# Patient Record
Sex: Male | Born: 1990 | Hispanic: No | Marital: Single | Smoking: Never smoker
Health system: Southern US, Community
[De-identification: ages and names within clinical notes are randomized; demographics above are authoritative.]

## PROBLEM LIST (undated history)

## (undated) DIAGNOSIS — F209 Schizophrenia, unspecified: Secondary | ICD-10-CM

---

## 2014-08-28 ENCOUNTER — Ambulatory Visit (HOSPITAL_COMMUNITY)
Admission: AD | Admit: 2014-08-28 | Discharge: 2014-08-28 | Disposition: A | Discharge status: Home / Self Care | Payer: 59 | Attending: Psychiatry | Admitting: Psychiatry

## 2014-08-28 ENCOUNTER — Emergency Department (HOSPITAL_COMMUNITY)
Admission: EM | Admit: 2014-08-28 | Discharge: 2014-08-29 | Disposition: A | Discharge status: Other Acute Inpatient Hospital | Payer: 59 | Attending: Emergency Medicine | Admitting: Emergency Medicine

## 2014-08-28 ENCOUNTER — Encounter (HOSPITAL_COMMUNITY): Payer: Self-pay | Admitting: *Deleted

## 2014-08-28 DIAGNOSIS — F419 Anxiety disorder, unspecified: Secondary | ICD-10-CM | POA: Diagnosis not present

## 2014-08-28 DIAGNOSIS — F209 Schizophrenia, unspecified: Secondary | ICD-10-CM | POA: Diagnosis not present

## 2014-08-28 DIAGNOSIS — Z008 Encounter for other general examination: Secondary | ICD-10-CM | POA: Diagnosis present

## 2014-08-28 DIAGNOSIS — F200 Paranoid schizophrenia: Secondary | ICD-10-CM | POA: Diagnosis present

## 2014-08-28 LAB — CBC
HCT: 47.3 % (ref 39.0–52.0)
HEMOGLOBIN: 15.6 g/dL (ref 13.0–17.0)
MCH: 30.8 pg (ref 26.0–34.0)
MCHC: 33 g/dL (ref 30.0–36.0)
MCV: 93.5 fL (ref 78.0–100.0)
PLATELETS: 272 10*3/uL (ref 150–400)
RBC: 5.06 MIL/uL (ref 4.22–5.81)
RDW: 12.7 % (ref 11.5–15.5)
WBC: 6.6 10*3/uL (ref 4.0–10.5)

## 2014-08-28 LAB — COMPREHENSIVE METABOLIC PANEL
ALT: 27 U/L (ref 17–63)
ANION GAP: 10 (ref 5–15)
AST: 29 U/L (ref 15–41)
Albumin: 5 g/dL (ref 3.5–5.0)
Alkaline Phosphatase: 74 U/L (ref 38–126)
BUN: 13 mg/dL (ref 6–20)
CO2: 32 mmol/L (ref 22–32)
CREATININE: 0.98 mg/dL (ref 0.61–1.24)
Calcium: 10 mg/dL (ref 8.9–10.3)
Chloride: 100 mmol/L — ABNORMAL LOW (ref 101–111)
GFR calc Af Amer: >60 mL/min (ref >60)
GFR calc non Af Amer: >60 mL/min (ref >60)
Glucose, Bld: 90 mg/dL (ref 65–99)
Potassium: 4.3 mmol/L (ref 3.5–5.1)
Sodium: 142 mmol/L (ref 135–145)
Total Bilirubin: 0.5 mg/dL (ref 0.3–1.2)
Total Protein: 8.3 g/dL — ABNORMAL HIGH (ref 6.5–8.1)

## 2014-08-28 LAB — ACETAMINOPHEN LEVEL: Acetaminophen (Tylenol), Serum: <10 ug/mL — ABNORMAL LOW (ref 10–30)

## 2014-08-28 LAB — RAPID URINE DRUG SCREEN, HOSP PERFORMED
Amphetamines: NOT DETECTED
Barbiturates: NOT DETECTED
Benzodiazepines: NOT DETECTED
Cocaine: NOT DETECTED
OPIATES: NOT DETECTED
TETRAHYDROCANNABINOL: NOT DETECTED

## 2014-08-28 LAB — ETHANOL: Alcohol, Ethyl (B): <5 mg/dL (ref <5)

## 2014-08-28 LAB — SALICYLATE LEVEL

## 2014-08-28 NOTE — ED Notes (Signed)
Pt presents with complaint of erratic behavior, paranoid & suspicious.  Denies SI, HI or AV hallucinations.  Denies feeling hopeless.  Pt reports he has never been diagnosed with mental illness. AAO x 3, no distress noted, calm & cooperative, monitoring for safety, Q 15 min checks in effect.

## 2014-08-28 NOTE — BH Assessment (Addendum)
Inpt recommended. [facility] per Clint Bolderori Beck, [facility] TTS seeking placement.   Sent referrals to: [facility]   Krisi Azua, [place] Triage Specialist 08/28/2014 10:18 PM

## 2014-08-28 NOTE — ED Provider Notes (Signed)
CSN: [id number]     Arrival date & time 08/28/14  1655 History   First MD Initiated Contact with Patient 08/28/14 1757     Chief Complaint  Patient presents with  . Medical Clearance     (Consider location/radiation/quality/duration/timing/severity/associated sxs/prior Treatment) Patient is a 24 y.o. male presenting with mental health disorder. The history is provided by the patient. No language interpreter was used.  Mental Health Problem Presenting symptoms: bizarre behavior   Degree of incapacity (severity):  Unable to specify Onset quality:  Unable to specify Timing:  Unable to specify Context: not alcohol use, not drug abuse, not medication and not stressful life event   Relieved by:  Nothing Worsened by:  Nothing tried Ineffective treatments:  None tried Associated symptoms: anxiety   Pt reports he is here because his family worries about him.  Pt reports he is anxious about his responsibility's. Pt denies suicidal or homicidal thoughts.  Pt denies depression.  Pt is difficult to converse with. He speaks slowly and seems distracted,  Pt admits interpersonal problems with people at his church.    No past medical history on file. No past surgical history on file. No family history on file. History  Substance Use Topics  . Smoking status: Not on file  . Smokeless tobacco: Not on file  . Alcohol Use: No    Review of Systems  Psychiatric/Behavioral: The patient is nervous/anxious.   All other systems reviewed and are negative.     Allergies  Poison ivy extract  Home Medications   Prior to Admission medications   Not on File   BP 144/96 mmHg  Pulse 77  Temp(Src) 98.7 F (37.1 C) (Oral)  Resp 18  SpO2 100% Physical Exam  Constitutional: He is oriented to person, place, and time. He appears well-developed and well-nourished.  HENT:  Head: Normocephalic.  Right Ear: External ear normal.  Left Ear: External ear normal.  Nose: Nose normal.  Eyes: Conjunctivae  and EOM are normal. Pupils are equal, round, and reactive to light.  Neck: Normal range of motion.  Cardiovascular: Normal rate and normal heart sounds.   Pulmonary/Chest: Effort normal.  Abdominal: Soft. He exhibits no distension.  Musculoskeletal: Normal range of motion.  Neurological: He is alert and oriented to person, place, and time.  Skin: Skin is warm.  Nursing note and vitals reviewed.   ED Course  Procedures (including critical care time) Labs Review Labs Reviewed  ACETAMINOPHEN LEVEL  CBC  COMPREHENSIVE METABOLIC PANEL  ETHANOL  SALICYLATE LEVEL  URINE RAPID DRUG SCREEN (HOSP PERFORMED)    Imaging Review No results found.   EKG Interpretation None     Results for orders placed or performed during the hospital encounter of 08/28/14  Acetaminophen level  Result Value Ref Range   Acetaminophen (Tylenol), Serum <10 (L) 10 - 30 ug/mL  CBC  Result Value Ref Range   WBC 6.6 4.0 - 10.5 K/uL   RBC 5.06 4.22 - 5.81 MIL/uL   Hemoglobin 15.6 13.0 - 17.0 g/dL   HCT 40.9 81.1 - 91.4 %   MCV 93.5 78.0 - 100.0 fL   MCH 30.8 26.0 - 34.0 pg   MCHC 33.0 30.0 - 36.0 g/dL   RDW 78.2 95.6 - 21.3 %   Platelets 272 150 - 400 K/uL  Comprehensive metabolic panel  Result Value Ref Range   Sodium 142 135 - 145 mmol/L   Potassium 4.3 3.5 - 5.1 mmol/L   Chloride 100 (L) 101 - 111 mmol/L  CO2 32 22 - 32 mmol/L   Glucose, Bld 90 65 - 99 mg/dL   BUN 13 6 - 20 mg/dL   Creatinine, Ser 1.610.98 0.61 - 1.24 mg/dL   Calcium 09.610.0 8.9 - 04.510.3 mg/dL   Total Protein 8.3 (H) 6.5 - 8.1 g/dL   Albumin 5.0 3.5 - 5.0 g/dL   AST 29 15 - 41 U/L   ALT 27 17 - 63 U/L   Alkaline Phosphatase 74 38 - 126 U/L   Total Bilirubin 0.5 0.3 - 1.2 mg/dL   GFR calc non Af Amer >60 >60 mL/min   GFR calc Af Amer >60 >60 mL/min   Anion gap 10 5 - 15  Ethanol (ETOH)  Result Value Ref Range   Alcohol, Ethyl (B) <5 <5 mg/dL  Salicylate level  Result Value Ref Range   Salicylate Lvl <4.0 2.8 - 30.0 mg/dL   Urine Drug Screen  Result Value Ref Range   Opiates NONE DETECTED NONE DETECTED   Cocaine NONE DETECTED NONE DETECTED   Benzodiazepines NONE DETECTED NONE DETECTED   Amphetamines NONE DETECTED NONE DETECTED   Tetrahydrocannabinol NONE DETECTED NONE DETECTED   Barbiturates NONE DETECTED NONE DETECTED   No results found.  MDM [facility] and was sent here for clearance and Psychiatric evaluation.  Notes report a family history of schizophrenia.    Final diagnoses:  None        Elson AreasLeslie K Sofia, PA-C 08/28/14 1838  Lonia SkinnerLeslie K Alice AcresSofia, PA-C 08/28/14 1901  Blake DivineJohn Wofford, MD 09/01/14 2033
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
## 2014-08-28 NOTE — ED Notes (Signed)
Patient will not sit onto stretcher he has been pacing. He prefers to stand

## 2014-08-28 NOTE — Progress Notes (Signed)
Patient presents to Ed with erratic behavior, apparent hallucinations, and aggression.  Per chart review, patient brought in by family.  Patient appeared nervous/suspicious when [facility] to him at bedside. Per chart review, patient without psychiatric history but with family history of schizophrenia.  Patient listed as having [employer].  Patient reports he does not have a pcp.  [facility] provided [facility] with list of [facility] who [facility] within a 20 mile radius of listed zip code [phone].  Discussed purpose of pcp.  Patient thankful for resources.  Patient asking if blood work is back yet.  EDCM asked EDRN.  No further EDCM needs at this time.

## 2014-08-29 DIAGNOSIS — F200 Paranoid schizophrenia: Secondary | ICD-10-CM | POA: Diagnosis present
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
## 2014-08-29 NOTE — BH Assessment (Addendum)
[facility] Assessment Progress Note   [facility]resa at [facility] said that patient had been accepted by Dr. Lonni Fixaj Thotokura.  [facility].  Nurse call report number is [phone].  She said that if patient is too psychotic he would need to be IVC'ed.   If he is willing to go and sign himself in than he can come voluntarily.  Clinician talked with patient and explained that [facility].  Clinician explained that he can go voluntarily and that transportation would be provided but he would need to sign himself in when he gets there.  Clinician gave patient opportunity to think it over and to talk with oncoming staff after 07:30.  Psychiatric team to consider IVC if clinically appropriate.

## 2014-09-26 ENCOUNTER — Ambulatory Visit (HOSPITAL_COMMUNITY): Payer: Self-pay | Admitting: Psychiatry

## 2014-10-02 ENCOUNTER — Ambulatory Visit (HOSPITAL_COMMUNITY): Payer: Self-pay | Admitting: Licensed Clinical Social Worker

## 2016-03-19 ENCOUNTER — Emergency Department (HOSPITAL_COMMUNITY): Payer: BLUE CROSS/BLUE SHIELD

## 2016-03-19 ENCOUNTER — Encounter (HOSPITAL_COMMUNITY): Payer: Self-pay

## 2016-03-19 ENCOUNTER — Emergency Department (HOSPITAL_COMMUNITY)
Admission: EM | Admit: 2016-03-19 | Discharge: 2016-03-20 | Disposition: A | Discharge status: Other Acute Inpatient Hospital | Payer: BLUE CROSS/BLUE SHIELD | Attending: Emergency Medicine | Admitting: Emergency Medicine

## 2016-03-19 DIAGNOSIS — Z79899 Other long term (current) drug therapy: Secondary | ICD-10-CM | POA: Diagnosis not present

## 2016-03-19 DIAGNOSIS — S1081XA Abrasion of other specified part of neck, initial encounter: Secondary | ICD-10-CM | POA: Insufficient documentation

## 2016-03-19 DIAGNOSIS — R930 Abnormal findings on diagnostic imaging of skull and head, not elsewhere classified: Secondary | ICD-10-CM | POA: Diagnosis not present

## 2016-03-19 DIAGNOSIS — F200 Paranoid schizophrenia: Secondary | ICD-10-CM | POA: Insufficient documentation

## 2016-03-19 DIAGNOSIS — Y939 Activity, unspecified: Secondary | ICD-10-CM | POA: Insufficient documentation

## 2016-03-19 DIAGNOSIS — Y9222 Religious institution as the place of occurrence of the external cause: Secondary | ICD-10-CM | POA: Diagnosis not present

## 2016-03-19 DIAGNOSIS — Z9109 Other allergy status, other than to drugs and biological substances: Secondary | ICD-10-CM | POA: Diagnosis not present

## 2016-03-19 DIAGNOSIS — X58XXXA Exposure to other specified factors, initial encounter: Secondary | ICD-10-CM | POA: Diagnosis not present

## 2016-03-19 DIAGNOSIS — Y999 Unspecified external cause status: Secondary | ICD-10-CM | POA: Insufficient documentation

## 2016-03-19 DIAGNOSIS — R45851 Suicidal ideations: Secondary | ICD-10-CM | POA: Diagnosis not present

## 2016-03-19 DIAGNOSIS — S199XXA Unspecified injury of neck, initial encounter: Secondary | ICD-10-CM | POA: Diagnosis present

## 2016-03-19 HISTORY — DX: Schizophrenia, unspecified: F20.9

## 2016-03-19 LAB — CBC WITH DIFFERENTIAL/PLATELET
BASOS ABS: 0 10*3/uL (ref 0.0–0.1)
BASOS PCT: 0 %
EOS ABS: 0 10*3/uL (ref 0.0–0.7)
Eosinophils Relative: 0 %
HEMATOCRIT: 45 % (ref 39.0–52.0)
Hemoglobin: 15.8 g/dL (ref 13.0–17.0)
Lymphocytes Relative: 10 %
Lymphs Abs: 1.6 10*3/uL (ref 0.7–4.0)
MCH: 31.3 pg (ref 26.0–34.0)
MCHC: 35.1 g/dL (ref 30.0–36.0)
MCV: 89.3 fL (ref 78.0–100.0)
MONOS PCT: 9 %
Monocytes Absolute: 1.4 10*3/uL — ABNORMAL HIGH (ref 0.1–1.0)
NEUTROS PCT: 81 %
Neutro Abs: 13 10*3/uL — ABNORMAL HIGH (ref 1.7–7.7)
Platelets: 295 10*3/uL (ref 150–400)
RBC: 5.04 MIL/uL (ref 4.22–5.81)
RDW: 12.6 % (ref 11.5–15.5)
WBC: 16 10*3/uL — AB (ref 4.0–10.5)

## 2016-03-19 LAB — URINALYSIS, ROUTINE W REFLEX MICROSCOPIC
BILIRUBIN URINE: NEGATIVE
GLUCOSE, UA: NEGATIVE mg/dL
KETONES UR: 80 mg/dL — AB
LEUKOCYTES UA: NEGATIVE
NITRITE: NEGATIVE
PH: 6 (ref 5.0–8.0)
PROTEIN: 30 mg/dL — AB
Specific Gravity, Urine: 1.029 (ref 1.005–1.030)
Squamous Epithelial / LPF: NONE SEEN

## 2016-03-19 LAB — COMPREHENSIVE METABOLIC PANEL
ALK PHOS: 68 U/L (ref 38–126)
ALT: 17 U/L (ref 17–63)
ANION GAP: 18 — AB (ref 5–15)
AST: 39 U/L (ref 15–41)
Albumin: 5.1 g/dL — ABNORMAL HIGH (ref 3.5–5.0)
BILIRUBIN TOTAL: 1.5 mg/dL — AB (ref 0.3–1.2)
BUN: 22 mg/dL — ABNORMAL HIGH (ref 6–20)
CALCIUM: 9.5 mg/dL (ref 8.9–10.3)
CO2: 23 mmol/L (ref 22–32)
Chloride: 98 mmol/L — ABNORMAL LOW (ref 101–111)
Creatinine, Ser: 0.99 mg/dL (ref 0.61–1.24)
GFR calc non Af Amer: >60 mL/min (ref >60)
Glucose, Bld: 98 mg/dL (ref 65–99)
POTASSIUM: 3.9 mmol/L (ref 3.5–5.1)
SODIUM: 139 mmol/L (ref 135–145)
TOTAL PROTEIN: 7.8 g/dL (ref 6.5–8.1)

## 2016-03-19 LAB — RAPID URINE DRUG SCREEN, HOSP PERFORMED
Amphetamines: NOT DETECTED
BARBITURATES: NOT DETECTED
BENZODIAZEPINES: NOT DETECTED
Cocaine: NOT DETECTED
Opiates: NOT DETECTED
Tetrahydrocannabinol: NOT DETECTED

## 2016-03-19 LAB — SALICYLATE LEVEL

## 2016-03-19 LAB — ACETAMINOPHEN LEVEL: Acetaminophen (Tylenol), Serum: <10 ug/mL — ABNORMAL LOW (ref 10–30)

## 2016-03-19 LAB — CK: CK TOTAL: 862 U/L — AB (ref 49–397)

## 2016-03-19 LAB — ETHANOL: Alcohol, Ethyl (B): <5 mg/dL (ref <5)

## 2016-03-19 MED ORDER — LORAZEPAM 2 MG/ML IJ SOLN
1.0000 mg | Freq: Once | INTRAMUSCULAR | Status: AC
  Administered 2016-03-19: 1 mg via INTRAVENOUS
  Filled 2016-03-19: qty 1

## 2016-03-19 MED ORDER — SODIUM CHLORIDE 0.9 % IV BOLUS (SEPSIS)
1000.0000 mL | Freq: Once | INTRAVENOUS | Status: AC
  Administered 2016-03-19: 1000 mL via INTRAVENOUS

## 2016-03-19 MED ORDER — LORAZEPAM 1 MG PO TABS: 1.0000 mg | ORAL_TABLET | Freq: Three times a day (TID) | ORAL | Status: DC | PRN

## 2016-03-19 MED ORDER — IBUPROFEN 200 MG PO TABS: 600.0000 mg | ORAL_TABLET | Freq: Three times a day (TID) | ORAL | Status: DC | PRN

## 2016-03-19 MED ORDER — ONDANSETRON HCL 4 MG PO TABS: 4.0000 mg | ORAL_TABLET | Freq: Three times a day (TID) | ORAL | Status: DC | PRN

## 2016-03-19 MED ORDER — ALUM & MAG HYDROXIDE-SIMETH 200-200-20 MG/5ML PO SUSP: 30.0000 mL | ORAL | Status: DC | PRN

## 2016-03-19 MED ORDER — HALOPERIDOL LACTATE 5 MG/ML IJ SOLN
5.0000 mg | Freq: Once | INTRAMUSCULAR | Status: AC
  Administered 2016-03-19: 5 mg via INTRAMUSCULAR
  Filled 2016-03-19: qty 1

## 2016-03-19 NOTE — ED Notes (Signed)
He becomes suddenly agitated and attempts to take his IV out. Security notified and pt. Is successfully calmed and redirected.

## 2016-03-19 NOTE — ED Notes (Signed)
He continues to breath normally and is in no distress. He mumbles and moves all extremities when we obtain his blood sample. He had placed himself prone; and repositions himself to his back when prompted.

## 2016-03-19 NOTE — BH Assessment (Addendum)
Assessment Note  Jose Klein is an 25 y.o. male with a history of Schizophrenia. Per EMS/ED notes patient was reportedly found outside in the cold. Family has reportedly been trying to locate him and they were concerned about suicidal thoughts. Upon assessment, pt appeared preoccupied, looking around the room, had what appeared to be thought blocking, and tangential speech.  He had soft speech, was oriented x 3, and appeared bizarre, in regular clothing.  Pt's main focus was on "black spells, magic balls, and black magic" and this is what he talked about for most of the assessment, but this was easily redirected. Pt stated he was a young member of a new church that had been "brainwashed" by the man. Writer asked patient further about the church. Patient would not further explain. He did however start to ask for various people, "Jose Klein, and Jose Klein". Writer asked whom these individuals were and patient stated, "I don't know".  Pt stated there were "points of contention," "animosity," and that he had been chosen to lead a congregation. Patient further stated that people were mocking him but he couldn't explain anything further.  Pt denies SI or HI. He denies history of SI and HI. No history of self injurious behaviors. Patient denies depressive symptoms. Patient denies anxiety related symptoms. Patient is calm and cooperative. No legal issues.  Pt denies AVH. Patient denies alcohol and drug use. No history of outpatient mental health treatment. No history of INPt mental health history.   Diagnosis: Schizophrenia  Past Medical History:  Past Medical History:  Diagnosis Date  . Schizophrenia (HCC)     History reviewed. No pertinent surgical history.  Family History: History reviewed. No pertinent family history.  Social History:  reports that he has never smoked. He has never used smokeless tobacco. He reports that he does not drink alcohol or use drugs.  Additional Social History:  Alcohol /  Drug Use Pain Medications: none Prescriptions: none Over the Counter: none History of alcohol / drug use?: No history of alcohol / drug abuse  CIWA: CIWA-Ar BP: 107/55 Pulse Rate: 96 COWS:    Allergies:  Allergies  Allergen Reactions  . Poison Ivy Extract [Poison Ivy Extract] Itching and Rash    Home Medications:  (Not in a hospital admission)  OB/GYN Status:  No LMP for male patient.  General Assessment Data Location of Assessment: [facility] TTS Assessment: In system Is this a Tele or Face-to-Face Assessment?: Face-to-Face Is this an Initial Assessment or a Re-assessment for this encounter?: Initial Assessment Marital status: Married [place] name:  (n/a) Is patient pregnant?: No Pregnancy Status: No Living Arrangements: Spouse/significant other Can pt return to current living arrangement?: Yes Admission Status: Voluntary Is patient capable of signing voluntary admission?: Yes Referral Source: Self/Family/Friend Insurance type:  Herbalist)     Crisis Care Plan Living Arrangements: Spouse/significant other Legal Guardian: Other: (no legal guardian) Name of Psychiatrist:  (none reported) Name of Therapist:  (none reported)     Risk to self with the past 6 months Suicidal Ideation: No Has patient been a risk to self within the past 6 months prior to admission? : No Suicidal Intent: No Has patient had any suicidal intent within the past 6 months prior to admission? : No Is patient at risk for suicide?: No Suicidal Plan?: No Has patient had any suicidal plan within the past 6 months prior to admission? : No Specify Current Suicidal Plan:  (no suicidal thoughts or history ) Access to Means: No What  has been your use of drugs/alcohol within the last 12 months?:  (denies ) Previous Attempts/Gestures: No How many times?:  (0) Other Self Harm Risks:  (denies ) Triggers for Past Attempts: Other (Comment) (no previous attempts and/or gestures ) Intentional Self Injurious  Behavior: None Family Suicide History: No Recent stressful life event(s): Other (Comment) (denies ) Persecutory voices/beliefs?: No Depression: No Substance abuse history and/or treatment for substance abuse?: No Suicide prevention information given to non-admitted patients: Not applicable  Risk to Others within the past 6 months Homicidal Ideation: No Does patient have any lifetime risk of violence toward others beyond the six months prior to admission? : No Thoughts of Harm to Others: No Current Homicidal Intent: No Current Homicidal Plan: No Access to Homicidal Means: No Identified Victim:  (n/a) History of harm to others?: No Assessment of Violence: None Noted Violent Behavior Description:  (patient is calm and cooperative ) Does patient have access to weapons?: No Criminal Charges Pending?: No Does patient have a court date: No Is patient on probation?: No  Psychosis Hallucinations:  ("Someone is using magic, cast spells, and haunting me") Delusions: Unspecified ("Patient believes someone has placed a spell on him")  Mental Status Report Appearance/Hygiene: In hospital gown Eye Contact: Good Motor Activity: Freedom of movement Speech: Logical/coherent Level of Consciousness: Alert Mood: Depressed Affect: Appropriate to circumstance Anxiety Level: None Thought Processes: Relevant, Coherent Judgement: Impaired Orientation: Place, Time, Situation, Person Obsessive Compulsive Thoughts/Behaviors: None  Cognitive Functioning Concentration: Decreased Memory: Remote Intact, Recent Intact IQ: Average Insight: Fair Impulse Control: Fair Appetite: Good Weight Loss:  (none reported) Weight Gain:  (none reported) Sleep: Decreased Total Hours of Sleep:  (6-8 hrs per night ) Vegetative Symptoms: None  ADLScreening [facility] Assessment Services) Patient's cognitive ability adequate to safely complete daily activities?: Yes Patient able to express need for assistance with  ADLs?: Yes Independently performs ADLs?: Yes (appropriate for developmental age)  Prior Inpatient Therapy Prior Inpatient Therapy: No Prior Therapy Dates:  (n/a) Prior Therapy Facilty/Provider(s):  (n/a) Reason for Treatment:  (n/a)  Prior Outpatient Therapy Prior Outpatient Therapy: No Prior Therapy Dates:  (n/a) Prior Therapy Facilty/Provider(s):  (n/a) Reason for Treatment:  (n/a) Does patient have an ACCT team?: No Does patient have Intensive In-House Services?  : No Does patient have [facility] services? : No Does patient have P4CC services?: No  ADL Screening (condition at time of admission) Patient's cognitive ability adequate to safely complete daily activities?: Yes Is the patient deaf or have difficulty hearing?: No Does the patient have difficulty seeing, even when wearing glasses/contacts?: No Does the patient have difficulty concentrating, remembering, or making decisions?: No Patient able to express need for assistance with ADLs?: Yes Does the patient have difficulty dressing or bathing?: No Independently performs ADLs?: Yes (appropriate for developmental age) Does the patient have difficulty walking or climbing stairs?: No Weakness of Legs: None Weakness of Arms/Hands: None  Home Assistive Devices/Equipment Home Assistive Devices/Equipment: None    Abuse/Neglect Assessment (Assessment to be complete while patient is alone) Physical Abuse: Denies Verbal Abuse: Denies Sexual Abuse: Denies Exploitation of patient/patient's resources: Denies Self-Neglect: Denies Values / Beliefs Cultural Requests During Hospitalization: None Spiritual Requests During Hospitalization: None   Advance Directives (For Healthcare) Does Patient Have a Medical Advance Directive?: No Would patient like information on creating a medical advance directive?: No - Patient declined Nutrition Screen- [facility] Adult/[facility] Patient's home diet: Regular  Additional Information 1:1 In Past 12  Months?: No CIRT Risk: No Elopement Risk: No Does  patient have medical clearance?: No     Disposition:  Disposition Initial Assessment Completed for this Encounter: Yes  On Site Evaluation by:   Reviewed with Physician:    Melynda Rippleoyka Tayari Yankee 03/19/2016 5:54 PM

## 2016-03-19 NOTE — ED Notes (Signed)
Pt admitted to room #39. Pt forwards little with this nurse,guarded, irritable at times, delusional. Pt reports "some stupid warlock was using black magic and got me in here."  Pt denies SI, HI. Denies AVH. Limited assessment d/t pt not wanting to participate in assessment at this time.  "you are kind of annoying me." Special checks q 15 mins in place for safety. Video monitoring in place.

## 2016-03-19 NOTE — ED Notes (Signed)
Patient denies SI, HI and AVH at this time. Patient appears anxious and paranoid at this time. Plan of care discussed with patient. Encouragement and support provided and safety maintain. Q 15 min safety check remain in place and video monitoring.

## 2016-03-19 NOTE — Progress Notes (Signed)
Pt listed without pcp nor insurance per EPIC -verified by ED registration  .  CM provided written information to assist pt with determining choice for uninsured accepting pcps, discussed the importance of pcp vs EDP services for f/u care, www.needymeds.org, www.goodrx.com, discounted pharmacies and other [place] resources such as [facility] , [facility], affordable care act, financial assistance, uninsured dental services, [place] med assist, DSS and  health department  Reviewed resources for [place] uninsured accepting pcps like [facility], family medicine at [address], [facility], [facility], local urgent care centers, [facility], [facility] family practice, general medical clinics, family services of the [place], [facility] urgent care plus others, medication resources, [facility] out patient pharmacies and housing Pt voiced understanding and appreciation of resources provided Left in pt belonging bag at pt bedside  Provided [facility] contact information

## 2016-03-19 NOTE — ED Notes (Signed)
Pt given urinal and stated he would give a sample.

## 2016-03-19 NOTE — ED Notes (Signed)
Empty urinal found on the floor. Pt states that he "feels like [he] needs a catheter." RN and MD made aware.

## 2016-03-19 NOTE — Progress Notes (Signed)
Entered in d/c instructions Please use the resources provided to you in emergency room by case manager to assist you're your choice of doctor for follow up  

## 2016-03-19 NOTE — ED Notes (Signed)
He startles awake when cxr performed. He moves all extremities with ease and continues to breath normally.

## 2016-03-19 NOTE — ED Provider Notes (Signed)
[facility]-EMERGENCY DEPT Provider Note   CSN: [id number] Arrival date & time: 03/19/16  0800     History   Chief Complaint Chief Complaint  Patient presents with  . Cold Exposure  . Suicidal    HPI Dewaine CongerJacob A Tabares is a 25 y.o. male.  The history is provided by the EMS personnel and the patient. No language interpreter was used.    Dewaine CongerJacob A Debski is a 25 y.o. male who presents to the Emergency Department complaining of cold exposure, SI.  Level V caveat due to AMS.  EMS report that he was found outside in the cold, they report that family has been trying to locate him and they were concerned about suicidal thoughts.  Per EMS records, patient stated my mom has been putting crystal ball spells on me for my whole life and I am tired of it.  Please don't let her near me.  IC my dad and obliquely.  The anti-christ came and he was putting patient in my dad and broths and he was telling them to come see me.  I saw 2 ghostly visions of my dad and probably told me to take a really big rock and smashed the windshield.  I'm being targeted by wear and x-rays people who think that it occurs because I look in to get me.  Look I'm not exaggerating my mom is using black magic and a crystal ball to come see me and distally form and they are going to rush over to this hospital to get me.  My mom is able to control time and space with her crystal ball and she is entering me telling people I'm the antichrist and they are going to come assess the knee.  They have been saying I'm on the [facility] website trying to pick up married woman and she might girlfriend.  He began to point at the ceiling and repeating, "God is my father".  The patient also kept stating "just let me die".    Past Medical History:  Diagnosis Date  . Schizophrenia [facility](HCC)     Patient Active Problem List   Diagnosis Date Noted  . Schizophrenia, unspecified type (HCC) 08/29/2014    History reviewed. No pertinent surgical  history.     Home Medications    Prior to Admission medications   Not on File    Family History History reviewed. No pertinent family history.  Social History Social History  Substance Use Topics  . Smoking status: Never Smoker  . Smokeless tobacco: Never Used  . Alcohol use No     Allergies   Poison ivy extract [poison ivy extract]   Review of Systems Review of Systems  Unable to perform ROS: Psychiatric disorder     Physical Exam Updated Vital Signs BP 147/81   Pulse 114   Temp 98.2 F (36.8 C) (Rectal)   Resp 23   SpO2 100%   Physical Exam  Constitutional: He is oriented to person, place, and time. He appears well-developed and well-nourished.  HENT:  Head: Normocephalic and atraumatic.  Pupils equal, round and reactive  Neck:  Superficial abrasion to the right upper neck  Cardiovascular: Regular rhythm.   No murmur heard. Tachycardiac  Pulmonary/Chest: Effort normal and breath sounds normal. No respiratory distress.  Abdominal: Soft. There is no tenderness. There is no rebound and no guarding.  Musculoskeletal: He exhibits no edema or tenderness.  Neurological: He is alert and oriented to person, place, and time.  Skin: Skin is  warm and dry.  Cool extremities with warm core  Psychiatric:  Agitated with visual hallucinations and paranoia  Nursing note and vitals reviewed.    ED Treatments / Results  Labs (all labs ordered are listed, but only abnormal results are displayed) Labs Reviewed  ACETAMINOPHEN LEVEL - Abnormal; Notable for the following:       Result Value   Acetaminophen (Tylenol), Serum <10 (*)    All other components within normal limits  COMPREHENSIVE METABOLIC PANEL - Abnormal; Notable for the following:    Chloride 98 (*)    BUN 22 (*)    Albumin 5.1 (*)    Total Bilirubin 1.5 (*)    Anion gap 18 (*)    All other components within normal limits  CBC WITH DIFFERENTIAL/PLATELET - Abnormal; Notable for the following:     WBC 16.0 (*)    Neutro Abs 13.0 (*)    Monocytes Absolute 1.4 (*)    All other components within normal limits  CK - Abnormal; Notable for the following:    Total CK 862 (*)    All other components within normal limits  ACETAMINOPHEN LEVEL - Abnormal; Notable for the following:    Acetaminophen (Tylenol), Serum <10 (*)    All other components within normal limits  ETHANOL  SALICYLATE LEVEL  URINALYSIS, ROUTINE W REFLEX MICROSCOPIC  RAPID URINE DRUG SCREEN, HOSP PERFORMED    EKG  EKG Interpretation  Date/Time:  Wednesday March 19 2016 08:06:17 EST Ventricular Rate:  124 PR Interval:    QRS Duration: 83 QT Interval:  307 QTC Calculation: 441 R Axis:   95 Text Interpretation:  Sinus tachycardia Borderline right axis deviation Repol abnrm suggests ischemia, inferior leads ST elevation, consider early repol Artifact Confirmed by Lincoln Brigham [id number]) on 03/19/2016 8:48:17 AM       Radiology Ct Head Wo Contrast  Result Date: 03/19/2016 CLINICAL DATA:  [place], lethargy, altered mental status EXAM: CT HEAD WITHOUT CONTRAST TECHNIQUE: Contiguous axial images were obtained from the base of the skull through the vertex without intravenous contrast. COMPARISON:  None FINDINGS: Brain: Normal ventricular morphology. No midline shift or mass effect. Normal appearance of brain parenchyma. No intracranial hemorrhage, mass lesion, evidence of acute infarction, or extra-axial fluid collection. Vascular: Normal appearance Skull: Intact Sinuses/Orbits: Clear Other: N/A IMPRESSION: Normal exam. Electronically Signed   By: Ulyses Southward M.D.   On: 03/19/2016 16:04   Dg Chest Port 1 View  Result Date: 03/19/2016 CLINICAL DATA:  Altered mental status EXAM: PORTABLE CHEST 1 VIEW COMPARISON:  None. FINDINGS: Lungs are clear. Heart size and pulmonary vascularity are normal. No adenopathy. No bone lesions. No pneumothorax. IMPRESSION: No edema or consolidation. Electronically Signed    By: Bretta Bang III M.D.   On: 03/19/2016 09:57    Procedures Procedures (including critical care time)  Medications Ordered in ED Medications  sodium chloride 0.9 % bolus 1,000 mL (0 mLs Intravenous Stopped 03/19/16 1019)  haloperidol lactate (HALDOL) injection 5 mg (5 mg Intramuscular Given 03/19/16 0829)  LORazepam (ATIVAN) injection 1 mg (1 mg Intravenous Given 03/19/16 0829)  sodium chloride 0.9 % bolus 1,000 mL (1,000 mLs Intravenous New Bag/Given 03/19/16 1645)     Initial Impression / Assessment and Plan / ED Course  I have reviewed the triage vital signs and the nursing notes.  Pertinent labs & imaging results that were available during my care of the patient were reviewed by me and considered in my medical decision  making (see chart for details).  Clinical Course     Patient here after exposure to cold with hallucinations and paranoia with suicidal thoughts.  He is not hypothermic, but does appear dehydrated on ED arrival.  He was treated with IV fluids.  Given his agitation.  He was treated with Haldol and Ativan.  On repeat assessment in the emergency department.  He appears improved, but does continue to have hallucinations and mild agitation.  He has been medically cleared for psychiatric evaluation and treatment  Final Clinical Impressions(s) / ED Diagnoses   Final diagnoses:  None    New Prescriptions New Prescriptions   No medications on file     Tilden FossaElizabeth Demetria Iwai, MD 03/19/16 [phone]

## 2016-03-19 NOTE — ED Triage Notes (Signed)
Per EMS, pt found laying in yard of church.  Pt was lethargic on EMS  arrival with cold extremities.  Feet blue with cold hands.  Pt was not answering questions.  Bystander stated pt tried to walk in front of his car at 6 am.  No jacket.  Pt had ice in nostrils.  Became aggressive in transport.  Pt is alert on arrival to ED and has started answering questions.  Per verbalization is regarding crystal balls, black magic, anti Christ, control universe, time travel, etc.  Pt cooperative at this time.  Vitals:  cbg 112, bp 140/92, hr 110, resp 24.  Sats unable to obtain.  20g IV in rt hand

## 2016-03-19 NOTE — ED Notes (Signed)
I have just given report to [facility] and he is escorted there at this time by a retinue of security guards and a nurse.

## 2016-03-19 NOTE — ED Notes (Signed)
He is now resting quietly and breathing normally. Whereas he had had tachycardia (sinus tach) upon arrival; currently he is in nsr without ectopy.

## 2016-03-19 NOTE — ED Notes (Signed)
Patient ambulated to TCU, so could speak with [place] with TTS in private room

## 2016-03-19 NOTE — ED Notes (Signed)
I will transport to ICU now.

## 2016-03-20 DIAGNOSIS — Z9109 Other allergy status, other than to drugs and biological substances: Secondary | ICD-10-CM

## 2016-03-20 DIAGNOSIS — F200 Paranoid schizophrenia: Secondary | ICD-10-CM | POA: Diagnosis not present

## 2016-03-20 DIAGNOSIS — R45851 Suicidal ideations: Secondary | ICD-10-CM

## 2016-03-20 DIAGNOSIS — Z79899 Other long term (current) drug therapy: Secondary | ICD-10-CM

## 2016-03-20 MED ORDER — HALOPERIDOL 2 MG PO TABS
2.0000 mg | ORAL_TABLET | Freq: Two times a day (BID) | ORAL | Status: DC
  Administered 2016-03-20: 2 mg via ORAL
  Filled 2016-03-20: qty 1

## 2016-03-20 MED ORDER — TRAZODONE HCL 100 MG PO TABS
100.0000 mg | ORAL_TABLET | Freq: Every evening | ORAL | Status: DC | PRN
Start: 2016-03-20 — End: 2016-03-20

## 2016-03-20 MED ORDER — OLANZAPINE 10 MG PO TBDP: 10.0000 mg | ORAL_TABLET | Freq: Three times a day (TID) | ORAL | Status: DC | PRN

## 2016-03-20 MED ORDER — AMANTADINE HCL 100 MG PO CAPS
100.0000 mg | ORAL_CAPSULE | Freq: Two times a day (BID) | ORAL | Status: DC
  Administered 2016-03-20: 100 mg via ORAL
  Filled 2016-03-20: qty 1

## 2016-03-20 NOTE — ED Notes (Signed)
Patient appears paranoid and to be responding to internal stimuli. Encouragement and support provided and safety maintain. Q 15 min safety check remain in place and video monitoring.

## 2016-03-20 NOTE — BH Assessment (Addendum)
[facility] Assessment Progress Note  Per Thedore MinsMojeed Akintayo, MD, this pt requires psychiatric hospitalization.  Pt presents under IVC initiated by EDP Tilden FossaElizabeth Rees, MD.  At 15:19 [facility]  Pt has been accepted to their facility by Dr Enedina FinnerGoli.  [name], DNP, concurs with this decision.  Pt's nurse, Jose Sheldonshley, has been notified, and agrees to call report to [phone].  [facility]   Jose Canninghomas Jose Grumbine, MA Triage Specialist [phone]

## 2016-03-20 NOTE — Progress Notes (Signed)
03/20/16 1350:  LRT was advised by staff that pt was inappropriate.  Caroll RancherMarjette Maddon Horton, LRT/CTRS

## 2016-03-20 NOTE — ED Notes (Signed)
Patient came to nurses station and stated "let me touch your hand" writer informed patient that we did not need to touch hands and patient stated "if you don't give me your hand I will hath to kill you" then patient proceeds to stick hand in window insisting that writer touch his hand. Patient walk away and stated "someone gone hath die". Patient returns to his room and safety maintain. Q 15 min safety checks remain in place and video monitoring.

## 2016-03-20 NOTE — ED Notes (Signed)
This nurse in pt room. Not forthcoming with information on approach. "I know your a witch will you go away." Special checks q 15 mins in place for safety. Video monitoring in place.

## 2016-03-20 NOTE — ED Notes (Signed)
Pt up at nurses station reporting his wife is here to visit. This nurse informed pt of visiting hours and that we had not been informed of a visitor at this time. Pt referring to this nurse as "an evil witch." demanding this nurse to take her glasses off. This nurse and security in hall to direct pt to his room, pt reports that he is not Jose Klein that he is [name]. Pt ambulatory to room without difficulty pt to room, reports he has to break the curse of his room and then closed the door. Will continue to monitor.

## 2016-03-20 NOTE — ED Notes (Addendum)
Pt turned chair upside down, laying supine in floor. This nurse and security to pt room, encouragement and support provided. Jameson,NP notified of pt behavior.

## 2016-03-20 NOTE — ED Notes (Signed)
Patient turned his chair upside down and paid down on  The floor with his arm stretched out wide. Them patient turned chair right side up and sat down in it. Safety maintain and Q 15 min safety checks remain in place and video monitoring.

## 2016-03-20 NOTE — ED Notes (Signed)
Patient continues to insist that his wife is waiting to visit him. Patient informed that he has no visitor at this time.

## 2016-03-20 NOTE — ED Notes (Signed)
Patient rung call bell in regards to believing wife was here to see him. Gave redirection that no one was visiting at the time.Pt. continues to be monitored at this time.

## 2016-03-20 NOTE — ED Notes (Signed)
Pt eating lunch
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
## 2016-03-20 NOTE — ED Notes (Signed)
[facility] by [facility] [facility] [employer], for continuation of specialized care. Belongings signed for and given to deputy. Pt left in no acute distress.

## 2018-01-13 IMAGING — CT CT HEAD W/O CM
3 of 4 series · 16 of 47 positions shown, 19 images · non-contrast
Comparison: None

CLINICAL DATA: [place], lethargy, altered
mental status

EXAM:
CT HEAD WITHOUT CONTRAST
TECHNIQUE: Contiguous axial images were obtained from the base of the skull
through the vertex without intravenous contrast.

[Series 2: head w/o · axial · non-contrast · 0.50mm/px · z∈[-154,-19]mm · 10 of 33 slices shown, 13 images]
[im 3/33  brain]
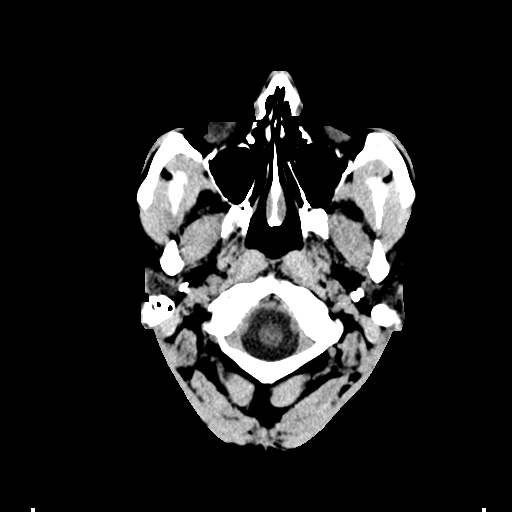
[im 3/33  bone]
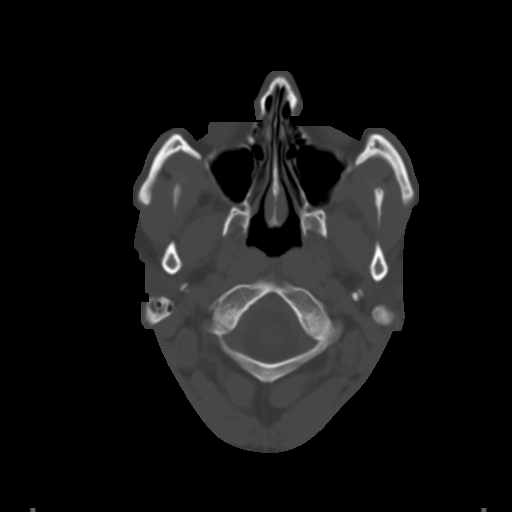
[im 5/33  brain]
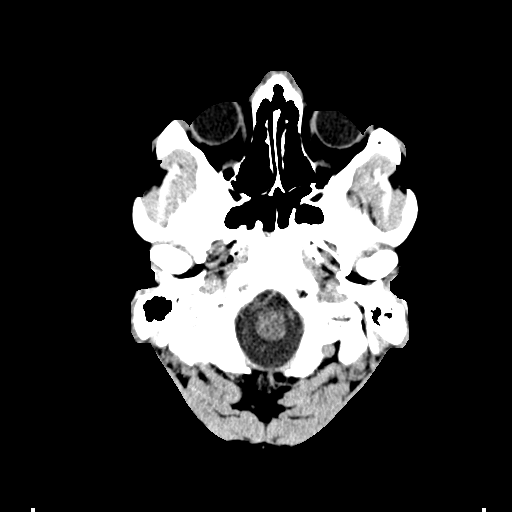
[im 10/33  brain]
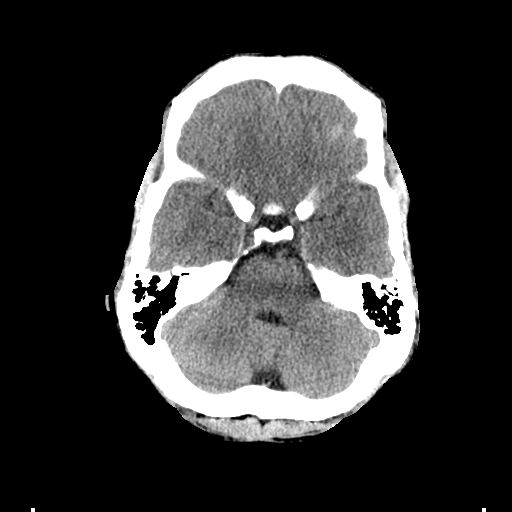
[im 12/33  brain]
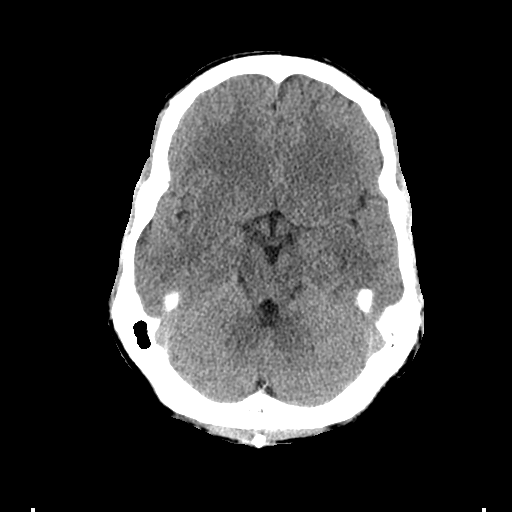
[im 14/33  brain]
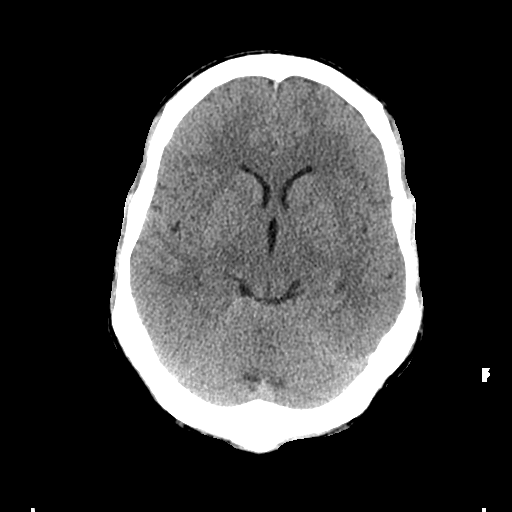
[im 14/33  bone]
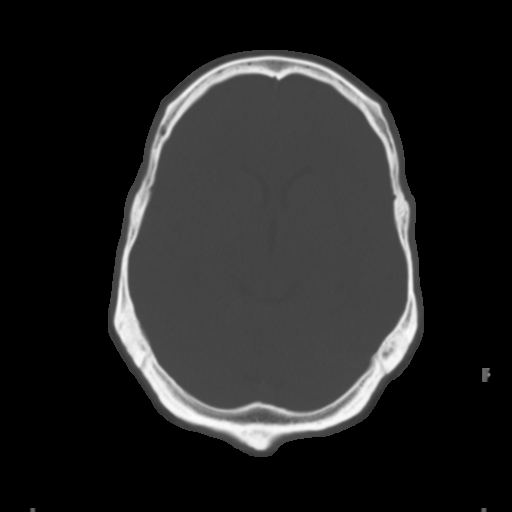
[im 19/33  brain]
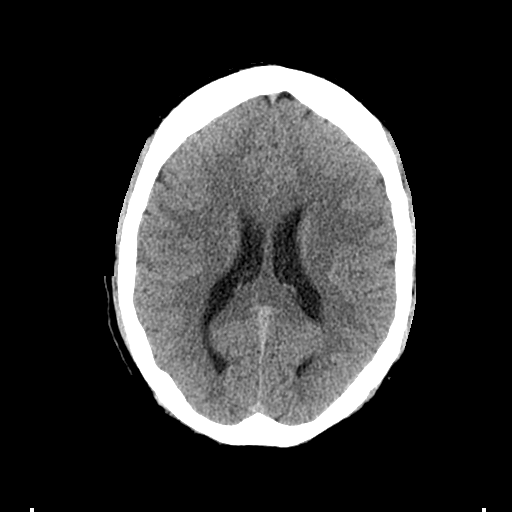
[im 21/33  brain]
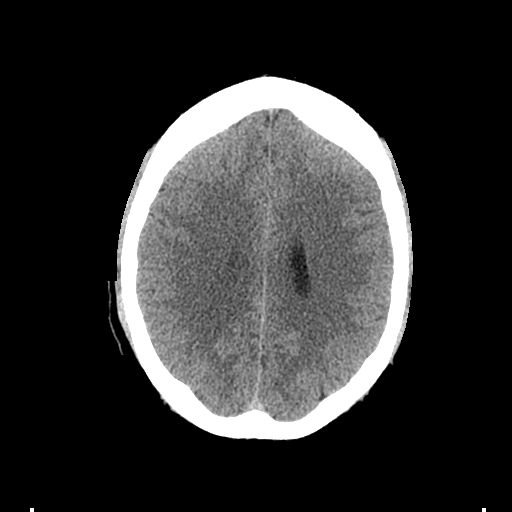
[im 23/33  brain]
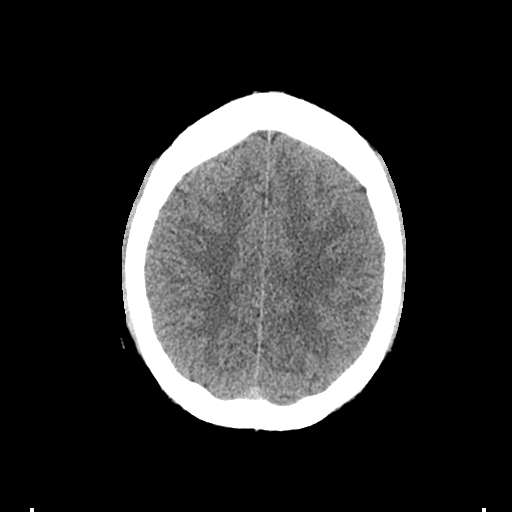
[im 28/33  brain]
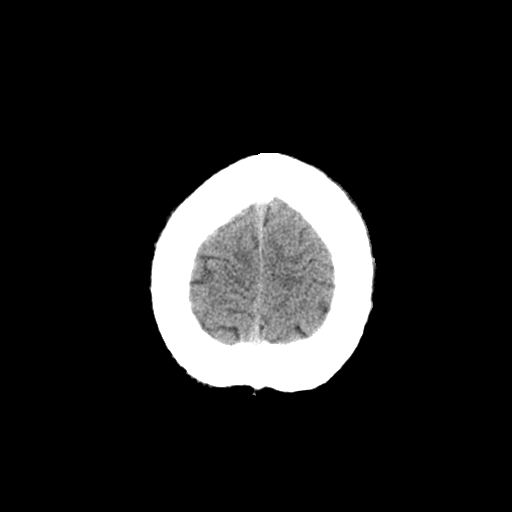
[im 28/33  bone]
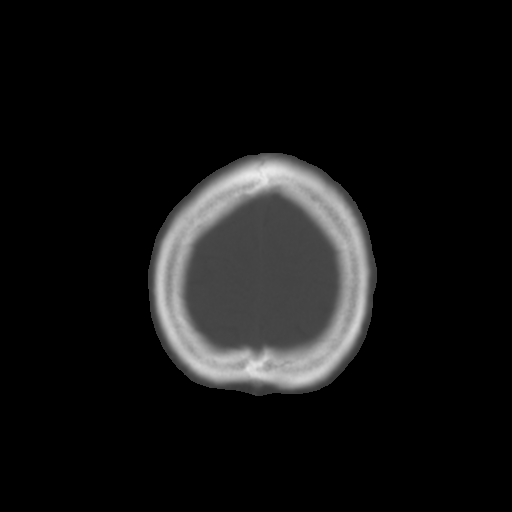
[im 30/33  brain]
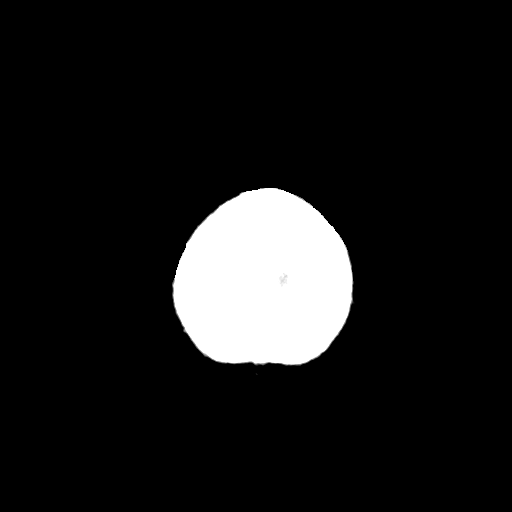

[Series 4: coronal · coronal · 0.34mm/px · 3 of 77 slices shown]
[im 26/77  brain]
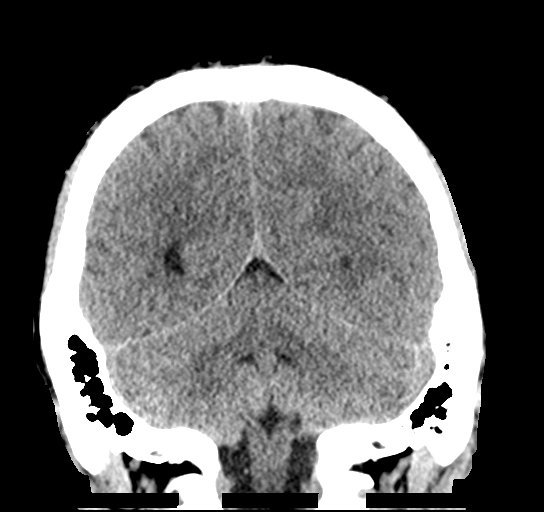
[im 34/77  brain]
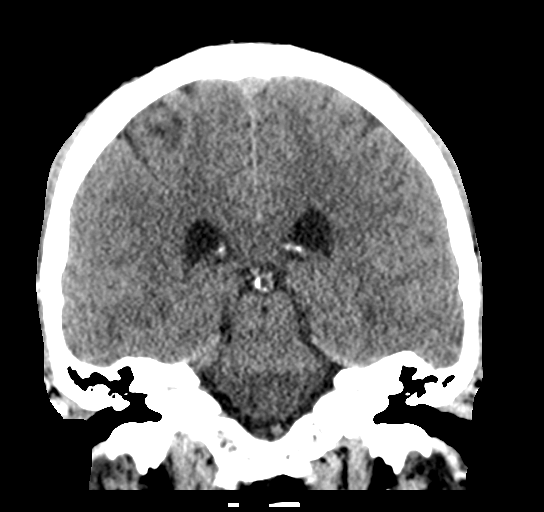
[im 43/77  brain]
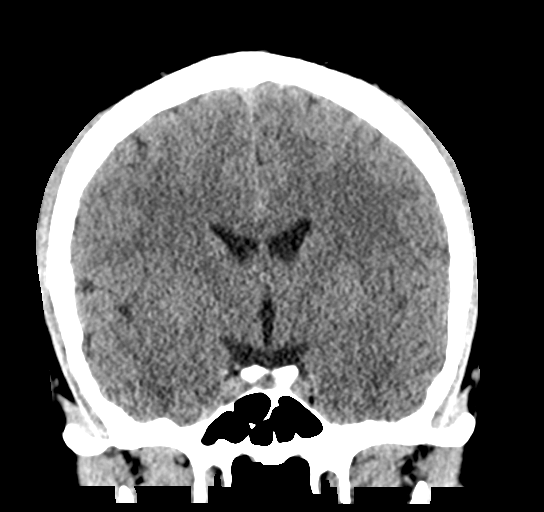

[Series 5: sagittal · sagittal · 0.34mm/px · 3 of 60 slices shown]
[im 20/60  brain]
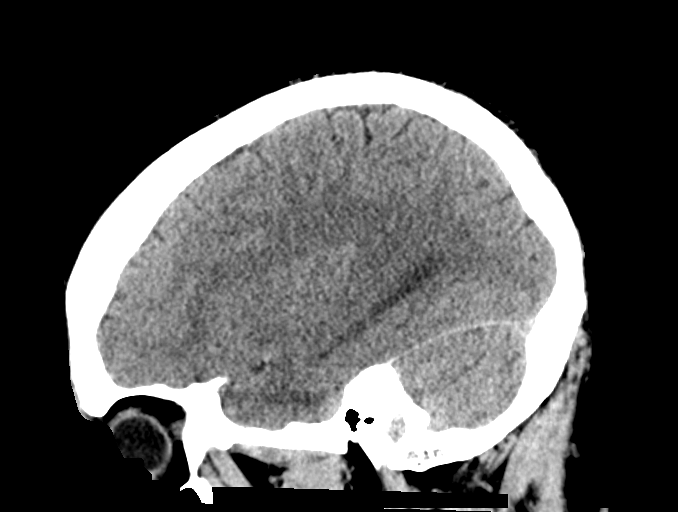
[im 30/60  brain]
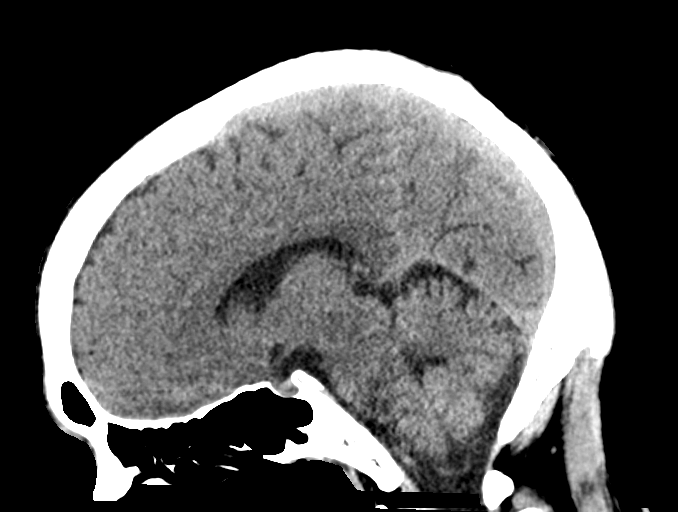
[im 40/60  brain]
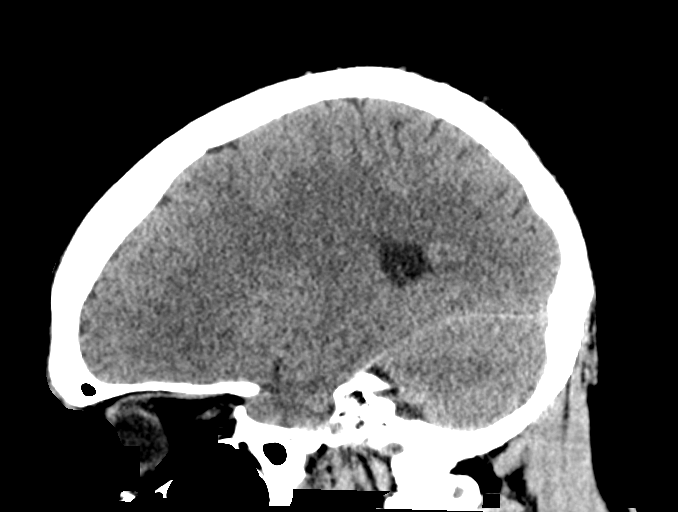

[16 of 47 positions shown; findings below may reference images not displayed]

FINDINGS: Brain: Normal ventricular morphology. No midline shift or mass
effect. Normal appearance of brain parenchyma. No intracranial
hemorrhage, mass lesion, evidence of acute infarction, or
extra-axial fluid collection.

Vascular: Normal appearance

Skull: Intact

Sinuses/Orbits: Clear

Other: N/A
IMPRESSION: Normal exam.

## 2018-01-13 IMAGING — DX DG CHEST 1V PORT
1 series · 1 of 1 positions shown · non-contrast
Comparison: None.

CLINICAL DATA: Altered mental status

EXAM:
PORTABLE CHEST 1 VIEW

[chest ap]
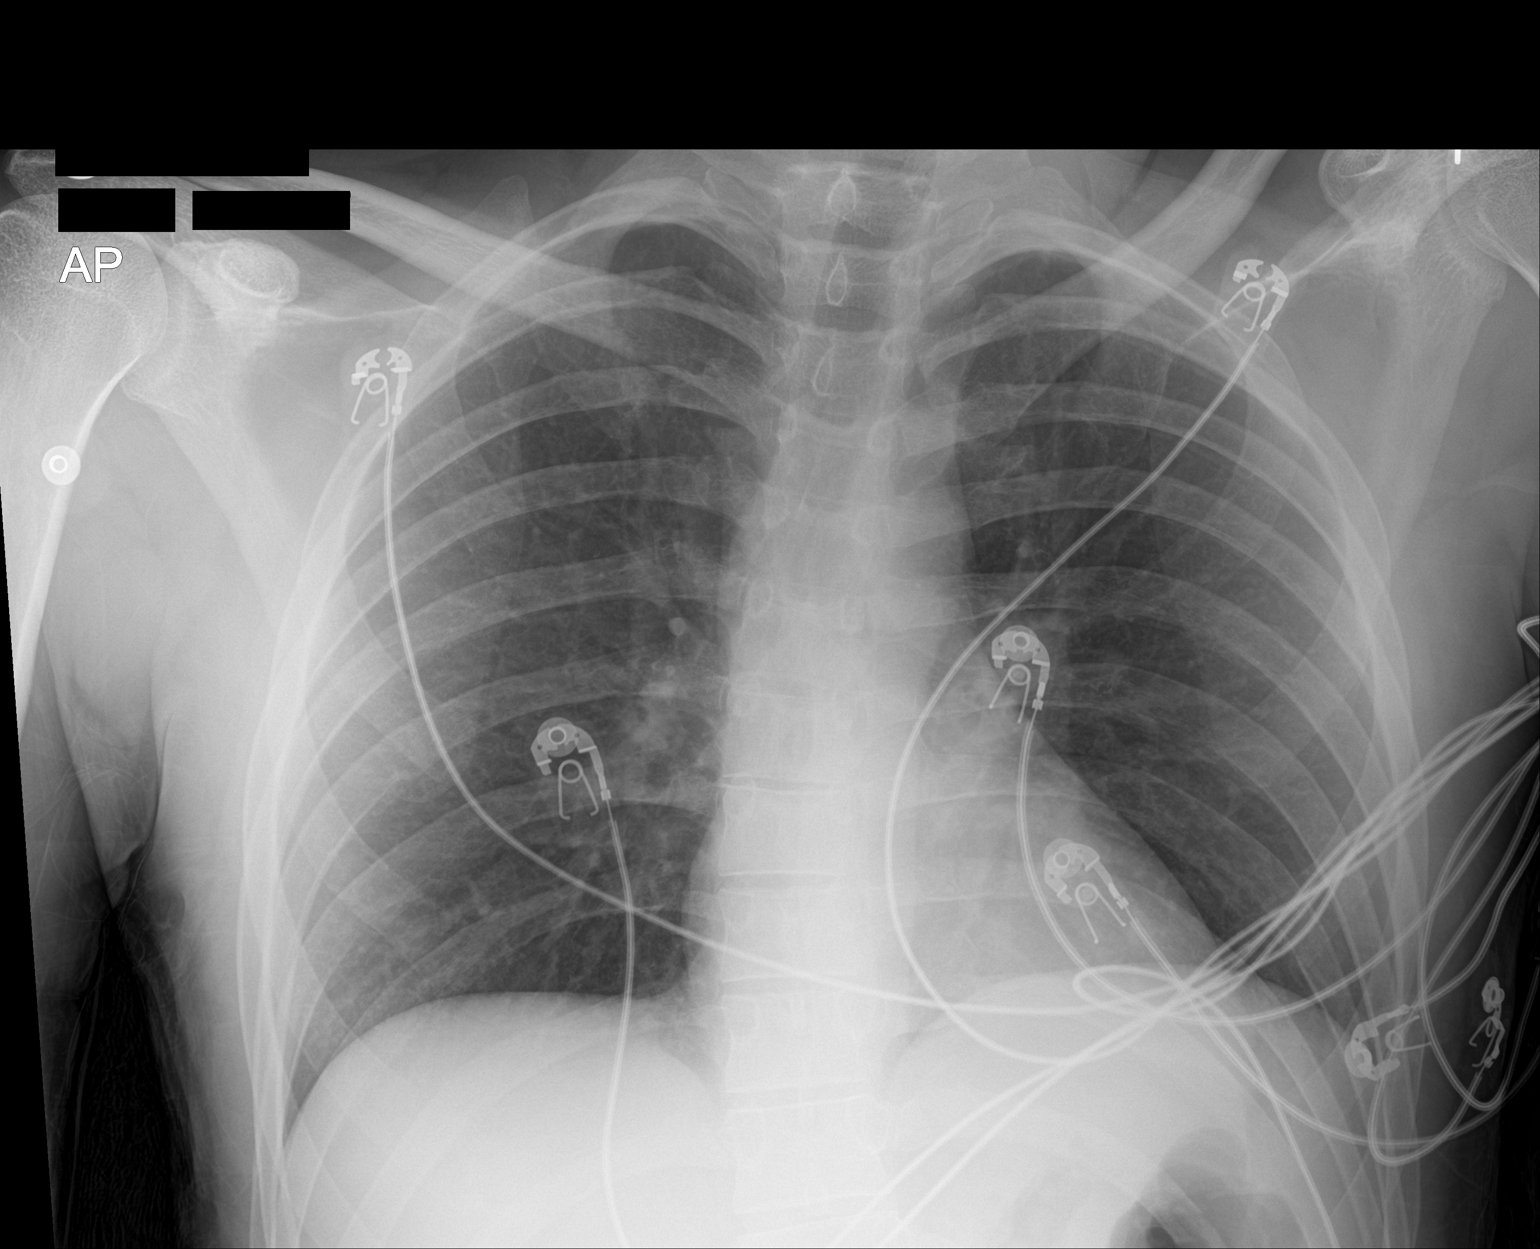

[1 of 1 positions shown; findings below may reference images not displayed]

FINDINGS: Lungs are clear. Heart size and pulmonary vascularity are normal. No
adenopathy. No bone lesions. No pneumothorax.
IMPRESSION: No edema or consolidation.
